# Patient Record
Sex: Female | Born: 2000 | Race: White | Hispanic: No | Marital: Single | Smoking: Never smoker
Health system: Southern US, Community
[De-identification: ages and names within clinical notes are randomized; demographics above are authoritative.]

## PROBLEM LIST (undated history)

## (undated) DIAGNOSIS — F419 Anxiety disorder, unspecified: Secondary | ICD-10-CM

## (undated) DIAGNOSIS — T7840XA Allergy, unspecified, initial encounter: Secondary | ICD-10-CM

## (undated) DIAGNOSIS — F32A Depression, unspecified: Secondary | ICD-10-CM

## (undated) HISTORY — DX: Allergy, unspecified, initial encounter: T78.40XA

## (undated) HISTORY — DX: Depression, unspecified: F32.A

## (undated) HISTORY — DX: Anxiety disorder, unspecified: F41.9

---
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
## 2018-02-19 DIAGNOSIS — R059 Cough, unspecified: Secondary | ICD-10-CM | POA: Insufficient documentation

## 2018-02-19 DIAGNOSIS — J020 Streptococcal pharyngitis: Secondary | ICD-10-CM | POA: Insufficient documentation

## 2019-05-08 ENCOUNTER — Other Ambulatory Visit: Payer: Self-pay | Admitting: Gastroenterology

## 2019-05-08 DIAGNOSIS — R1013 Epigastric pain: Secondary | ICD-10-CM

## 2019-05-11 ENCOUNTER — Ambulatory Visit
Admission: RE | Admit: 2019-05-11 | Discharge: 2019-05-11 | Disposition: A | Discharge status: Home / Self Care | Payer: BLUE CROSS/BLUE SHIELD | Source: Ambulatory Visit | Attending: Gastroenterology | Admitting: Gastroenterology

## 2019-05-11 ENCOUNTER — Other Ambulatory Visit: Payer: Self-pay

## 2019-05-11 DIAGNOSIS — R1013 Epigastric pain: Secondary | ICD-10-CM

## 2019-06-07 DIAGNOSIS — K219 Gastro-esophageal reflux disease without esophagitis: Secondary | ICD-10-CM | POA: Insufficient documentation

## 2020-05-28 DIAGNOSIS — F909 Attention-deficit hyperactivity disorder, unspecified type: Secondary | ICD-10-CM | POA: Insufficient documentation

## 2020-06-02 DIAGNOSIS — F1290 Cannabis use, unspecified, uncomplicated: Secondary | ICD-10-CM | POA: Insufficient documentation

## 2020-07-20 IMAGING — RF UPPER GI W/ SMALL BOWEL
14 of 20 series · 14 of 20 positions shown · non-contrast
Comparison: None.

CLINICAL DATA: Dyspepsia, nausea, stomach cramping

EXAM:
UPPER GI SERIES WITH SMALL BOWEL FOLLOW-THROUGH
FLUOROSCOPY TIME:  Fluoroscopy Time:  48 seconds
Radiation Exposure Index (if provided by the fluoroscopic device):
12.2 mGy
Number of Acquired Spot Images: 0
TECHNIQUE: Combined double contrast and single contrast upper GI series using
effervescent crystals, thick barium, and thin barium. Subsequently,
serial images of the small bowel were obtained including spot views
of the terminal ileum.

[Series 1: t abdomen supine · 0.15mm/px · 1 of 1 slices shown]
[im 1/1]
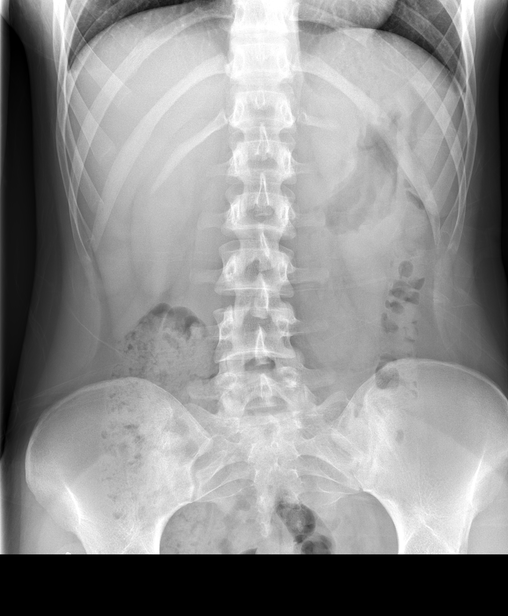

[Series 3: cp_standard · 0.25mm/px · 1 of 1 slices shown (1 of 12)]
[im 1/1]
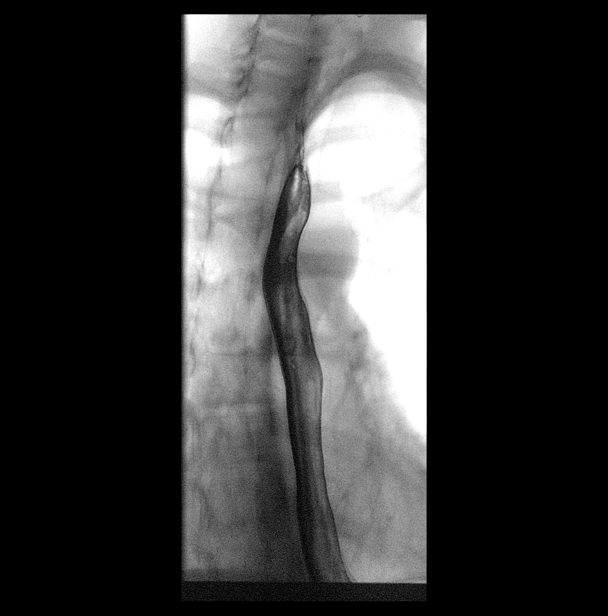

[Series 4: cp_standard · 0.25mm/px · 1 of 1 slices shown (2 of 12)]
[im 1/1]
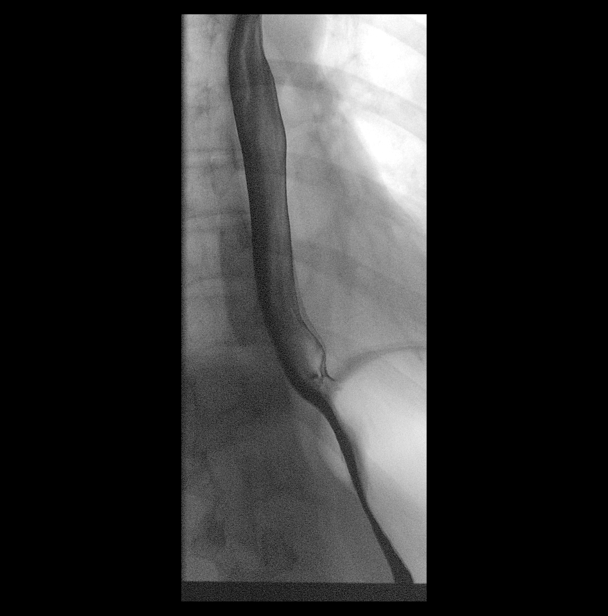

[Series 6: cp_standard · 0.28mm/px · 1 of 1 slices shown (3 of 12)]
[im 1/1]
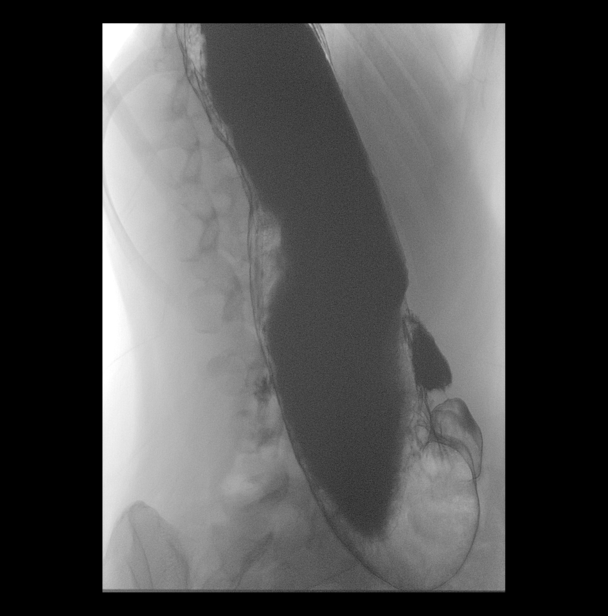

[Series 7: cp_standard · 0.28mm/px · 1 of 1 slices shown (4 of 12)]
[im 1/1]
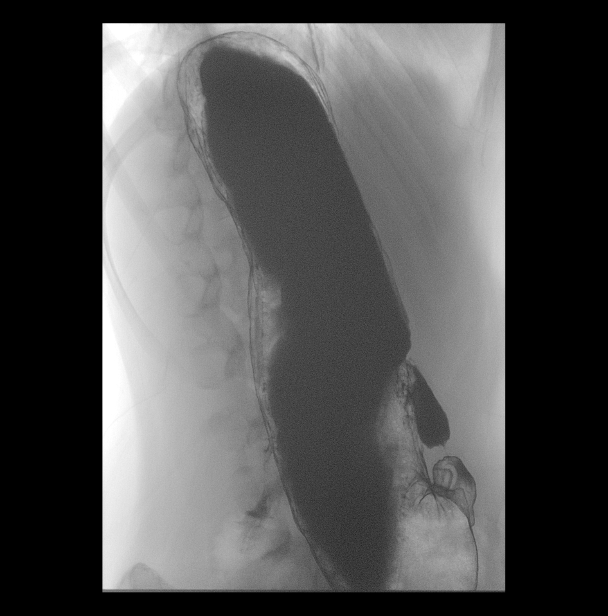

[Series 8: cp_standard · 0.28mm/px · 1 of 1 slices shown (5 of 12)]
[im 1/1]
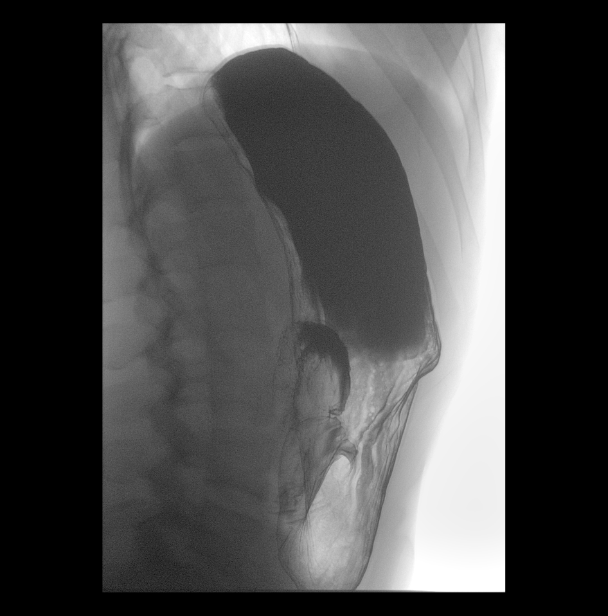

[Series 10: cp_standard · 0.28mm/px · 1 of 1 slices shown (6 of 12)]
[im 1/1]
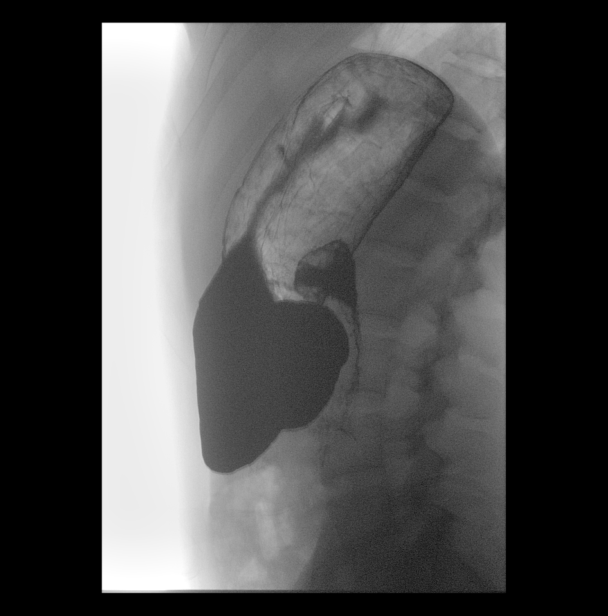

[Series 11: cp_standard · 0.28mm/px · 1 of 1 slices shown (7 of 12)]
[im 1/1]
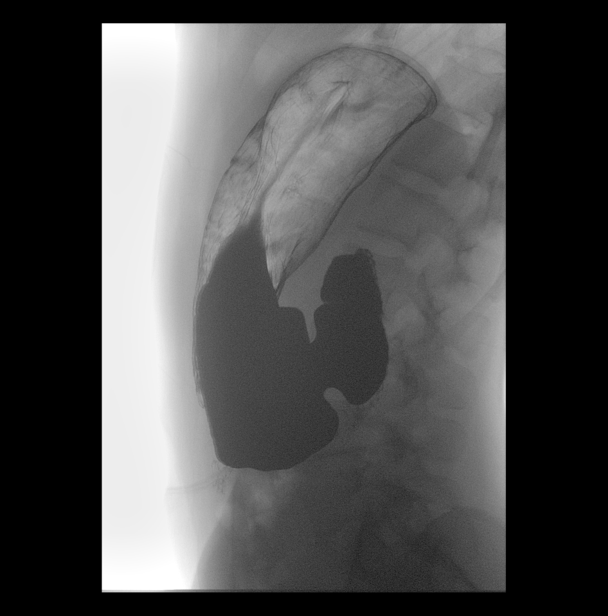

[Series 13: cp_standard · 0.28mm/px · 1 of 1 slices shown (8 of 12)]
[im 1/1]
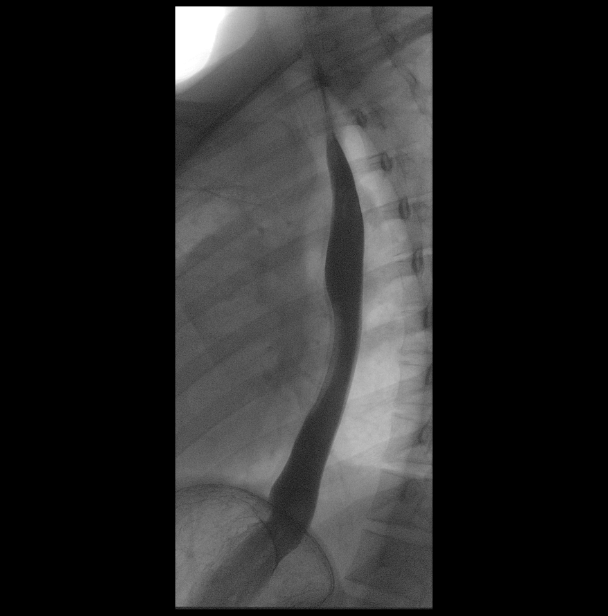

[Series 14: cp_standard · 0.28mm/px · 1 of 1 slices shown (9 of 12)]
[im 1/1]
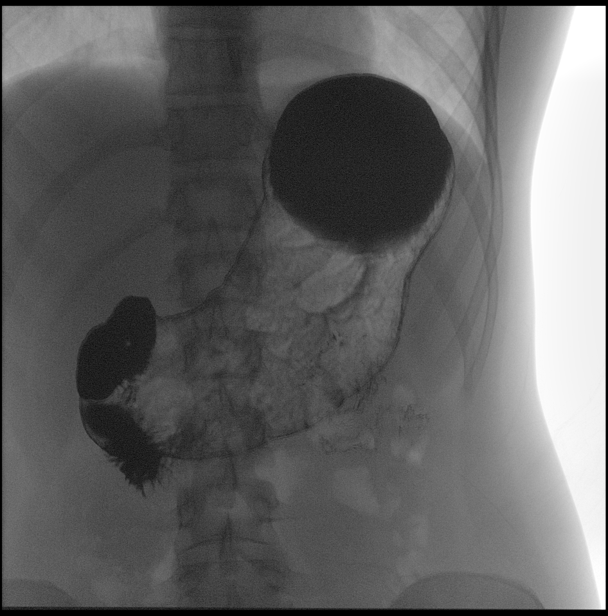

[Series 16: t abdomen barium ap · 0.15mm/px · 1 of 1 slices shown]
[im 1/1]
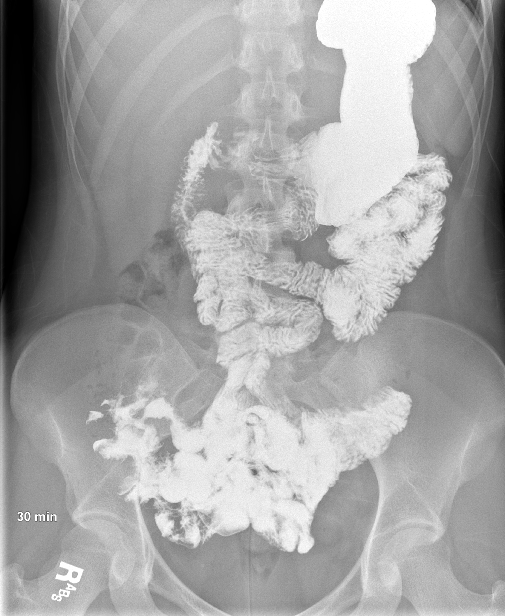

[Series 17: cp_standard · 0.27mm/px · 1 of 1 slices shown (10 of 12)]
[im 1/1]
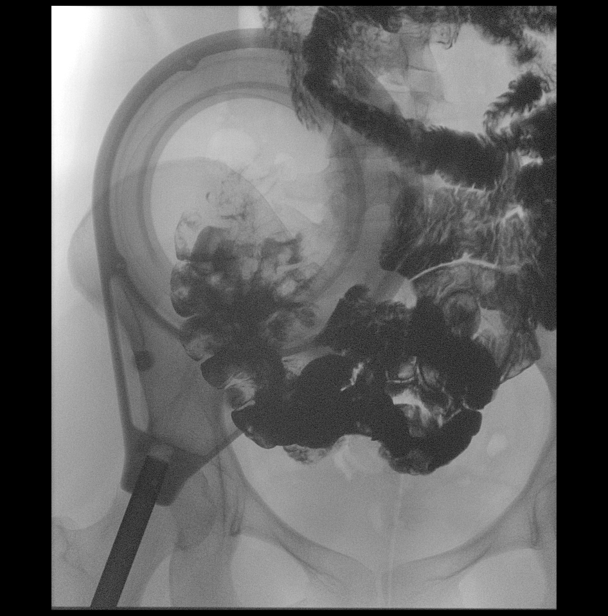

[Series 18: cp_standard · 0.27mm/px · 1 of 1 slices shown (11 of 12)]
[im 1/1]
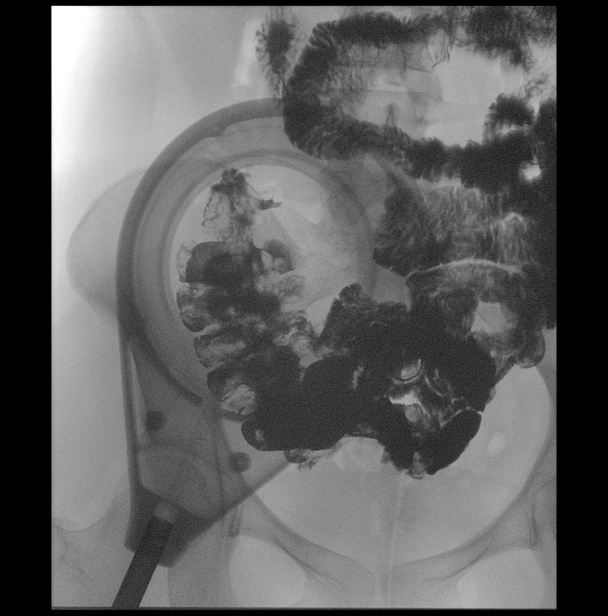

[Series 20: cp_standard · 0.26mm/px · 1 of 1 slices shown (12 of 12)]
[im 1/1]
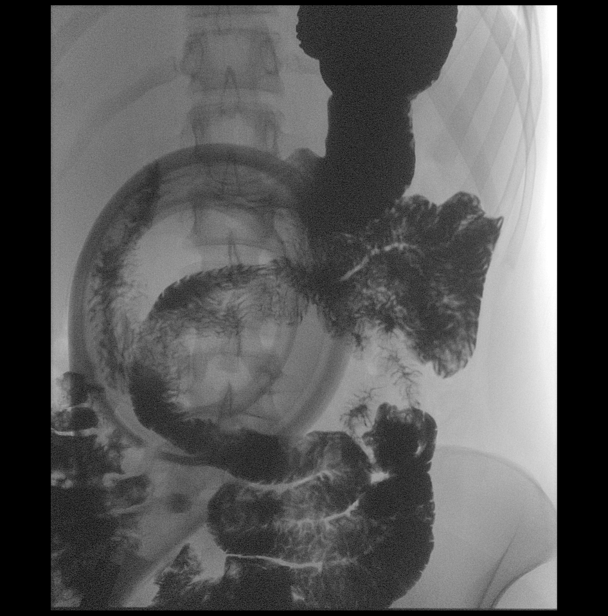

[14 of 20 positions shown; findings below may reference images not displayed]

FINDINGS: KUB:

There is no bowel dilatation to suggest obstruction. There is no
evidence of pneumoperitoneum, portal venous gas or pneumatosis.

There are no pathologic calcifications along the expected course of
the ureters.

The osseous structures are unremarkable.

UPPER GI SERIES AND SMALL-BOWEL FOLLOW-THROUGH:

Examination of the esophagus demonstrated normal esophageal
motility. Normal esophageal morphology without evidence of
esophagitis or ulceration. No esophageal stricture, diverticula, or
mass lesion. No evidence of hiatal hernia. Mild gastroesophageal
reflux.

Examination of the stomach demonstrated normal rugal folds and areae
gastricae. The gastric mucosa appeared unremarkable without evidence
of ulceration, scarring, or mass lesion. Gastric motility and
emptying was normal. Fluoroscopic examination of the duodenum
demonstrates normal motility and morphology without evidence of
ulceration or mass lesion.

Medium density barium was periodically observed under fluoroscopy to
travel from the stomach to the ascending colon (over a 30 minute
time period). There is no evidence of small bowel stricture or
obstruction. No large filling defects to suggest mass lesion. In
addition, there is no evidence of tethering or definite inflammatory
changes present within the small bowel.
IMPRESSION: 1. Mild gastroesophageal reflux.
2. Otherwise normal upper GI.
3. Normal small bowel follow-through.

## 2021-08-26 ENCOUNTER — Ambulatory Visit: Payer: 59 | Admitting: Family Medicine

## 2021-08-26 ENCOUNTER — Other Ambulatory Visit: Payer: Self-pay

## 2021-08-26 ENCOUNTER — Encounter: Payer: Self-pay | Admitting: Family Medicine

## 2021-08-26 VITALS — BP 118/66 | HR 114 | Ht 68.0 in | Wt 137.0 lb

## 2021-08-26 DIAGNOSIS — F419 Anxiety disorder, unspecified: Secondary | ICD-10-CM | POA: Insufficient documentation

## 2021-08-26 DIAGNOSIS — J45909 Unspecified asthma, uncomplicated: Secondary | ICD-10-CM | POA: Insufficient documentation

## 2021-08-26 DIAGNOSIS — J301 Allergic rhinitis due to pollen: Secondary | ICD-10-CM | POA: Diagnosis not present

## 2021-08-26 DIAGNOSIS — J309 Allergic rhinitis, unspecified: Secondary | ICD-10-CM

## 2021-08-26 DIAGNOSIS — J4520 Mild intermittent asthma, uncomplicated: Secondary | ICD-10-CM

## 2021-08-26 DIAGNOSIS — F909 Attention-deficit hyperactivity disorder, unspecified type: Secondary | ICD-10-CM | POA: Insufficient documentation

## 2021-08-26 DIAGNOSIS — Z0000 Encounter for general adult medical examination without abnormal findings: Secondary | ICD-10-CM

## 2021-08-26 DIAGNOSIS — G43909 Migraine, unspecified, not intractable, without status migrainosus: Secondary | ICD-10-CM | POA: Insufficient documentation

## 2021-08-26 MED ORDER — MONTELUKAST SODIUM 10 MG PO TABS: 10.0000 mg | ORAL_TABLET | Freq: Every day | ORAL | 3 refills | Status: DC

## 2021-08-26 MED ORDER — FLUTICASONE FUROATE-VILANTEROL 200-25 MCG/ACT IN AEPB: 1.0000 | INHALATION_SPRAY | Freq: Every day | RESPIRATORY_TRACT | 3 refills | Status: DC

## 2021-08-26 MED ORDER — LEVOCETIRIZINE DIHYDROCHLORIDE 5 MG PO TABS: 5.0000 mg | ORAL_TABLET | Freq: Every evening | ORAL | 3 refills | Status: DC

## 2021-08-26 NOTE — Progress Notes (Signed)
Date:  08/26/2021   Name:  Alexandra Vazquez   DOB:  10-Sep-2001   MRN:  [id number]   Chief Complaint: New Patient (Initial Visit)  Patient is a 20 year old female who presents for a establish care exam. The patient reports the following problems: reactive airway/headache/ allergic rhinitis. Health maintenance has been reviewed up to date.     No results found for: CREATININE, BUN, NA, K, CL, CO2 No results found for: CHOL, HDL, LDLCALC, LDLDIRECT, TRIG, CHOLHDL No results found for: TSH No results found for: HGBA1C Lab Results  Component Value Date   WBC 7.8 02/14/2017   HGB 12.9 02/14/2017   HCT 38.2 02/14/2017   MCV 91.1 02/14/2017   PLT 202 02/14/2017   No results found for: ALT, AST, GGT, ALKPHOS, BILITOT   Review of Systems  Constitutional:  Negative for chills and fever.  HENT:  Negative for drooling, ear discharge, ear pain and sore throat.   Respiratory:  Negative for cough, shortness of breath and wheezing.   Cardiovascular:  Negative for chest pain, palpitations and leg swelling.  Gastrointestinal:  Negative for abdominal pain, blood in stool, constipation, diarrhea and nausea.  Endocrine: Negative for polydipsia.  Genitourinary:  Negative for dysuria, frequency, hematuria and urgency.  Musculoskeletal:  Negative for back pain, myalgias and neck pain.  Skin:  Negative for rash.  Allergic/Immunologic: Negative for environmental allergies.  Neurological:  Negative for dizziness and headaches.  Hematological:  Does not bruise/bleed easily.  Psychiatric/Behavioral:  Negative for suicidal ideas. The patient is not nervous/anxious.    Patient Active Problem List   Diagnosis Date Noted   Migraines 08/26/2021   Anxiety 08/26/2021   ADHD 08/26/2021   Asthma 08/26/2021   Marijuana use 06/02/2020   Adult ADHD 05/28/2020   GERD (gastroesophageal reflux disease) 06/07/2019   Cough 02/19/2018   Pharyngitis, streptococcal 02/19/2018   Pharyngitis 02/14/2017    No  Known Allergies  No past surgical history on file.  Social History   Tobacco Use   Smoking status: Never   Smokeless tobacco: Never  Substance Use Topics   Alcohol use: No   Drug use: No     Medication list has been reviewed and updated.  No outpatient medications have been marked as taking for the 08/26/21 encounter (Office Visit) with Duanne Limerick, MD.    No flowsheet data found.  No flowsheet data found.  BP Readings from Last 3 Encounters:  02/14/17 112/64    Physical Exam Vitals and nursing note reviewed.  Constitutional:      General: She is not in acute distress.    Appearance: She is not diaphoretic.  HENT:     Head: Normocephalic and atraumatic.     Right Ear: External ear normal.     Left Ear: External ear normal.  Eyes:     General:        Right eye: No discharge.        Left eye: No discharge.     Conjunctiva/sclera: Conjunctivae normal.     Pupils: Pupils are equal, round, and reactive to light.  Neck:     Thyroid: No thyromegaly.     Vascular: No JVD.  Cardiovascular:     Rate and Rhythm: Normal rate and regular rhythm.     Heart sounds: Normal heart sounds, S1 normal and S2 normal. No murmur heard. No systolic murmur is present.  No diastolic murmur is present.    No friction rub. No gallop. No  S3 or S4 sounds.  Pulmonary:     Effort: Pulmonary effort is normal.     Breath sounds: Normal breath sounds.  Abdominal:     General: Bowel sounds are normal.     Palpations: Abdomen is soft. There is no mass.     Tenderness: There is no abdominal tenderness. There is no guarding.  Musculoskeletal:     Cervical back: Normal range of motion and neck supple.  Lymphadenopathy:     Cervical: No cervical adenopathy.  Neurological:     Mental Status: She is alert.     Deep Tendon Reflexes: Reflexes are normal and symmetric.    Wt Readings from Last 3 Encounters:  02/14/17 129 lb 12.8 oz (58.9 kg) (60 %, Z= 0.25)*   * Growth percentiles are  based on CDC (Girls, 2-20 Years) data.    Ht 5\' 8"  (1.727 m)   BMI 19.74 kg/m   Assessment and Plan:  1. Encounter for medical examination to establish care Patient establishing care with new physician.  Review of patient's chart for previous encounters most recent labs most recent imaging in [place].  Patient's medications were reviewed and we will continue with her GYN care with OB/GYN and psychotropic medications with her psychiatrist.  2. Mild intermittent asthma without complication Chronic.  Episodic.  Stable.  Continue Singulair 10 mg once a day. - montelukast (SINGULAIR) 10 MG tablet; Take 1 tablet (10 mg total) by mouth at bedtime.  Dispense: 90 tablet; Refill: 3 - fluticasone furoate-vilanterol (BREO ELLIPTA) 200-25 MCG/ACT AEPB; Inhale 1 puff into the lungs daily.  Dispense: 28 each; Refill: 3  3. Allergic rhinitis due to pollen, unspecified seasonality Chronic.  Controlled.  Stable.  Continue Singulair 10 mg once a day.  As well as Xyzal 5 mg once a day. - montelukast (SINGULAIR) 10 MG tablet; Take 1 tablet (10 mg total) by mouth at bedtime.  Dispense: 90 tablet; Refill: 3 - levocetirizine (XYZAL) 5 MG tablet; Take 1 tablet (5 mg total) by mouth every evening.  Dispense: 90 tablet; Refill: 3  4. Allergic sinusitis Patient has some sinus issues with frontal headaches bilateral on a daily basis.  I have suggested that she could start with taking some propranolol that she already takes once a day in the morning to see if this could prophylaxis prevent headaches but most likely these are sinus of the frontal sinus pressure related and addition of Flonase or Nasacort with her above medications would be prudent. - montelukast (SINGULAIR) 10 MG tablet; Take 1 tablet (10 mg total) by mouth at bedtime.  Dispense: 90 tablet; Refill: 3 - levocetirizine (XYZAL) 5 MG tablet; Take 1 tablet (5 mg total) by mouth every evening.  Dispense: 90 tablet; Refill: 3   Patient does have  reactive airway disease she is currently not using albuterol but I have invited her to call if she needs me to send that in in the meantime [facility] has been extended for a year on an as-needed basis.

## 2021-11-07 ENCOUNTER — Ambulatory Visit: Payer: Self-pay | Admitting: Family Medicine

## 2022-06-30 ENCOUNTER — Encounter: Payer: Self-pay | Admitting: Family Medicine

## 2022-07-27 ENCOUNTER — Ambulatory Visit: Payer: 59 | Admitting: Family Medicine

## 2022-07-31 LAB — HM PAP SMEAR: HM Pap smear: NORMAL

## 2022-07-31 LAB — RESULTS CONSOLE HPV: CHL HPV: NEGATIVE

## 2022-08-22 ENCOUNTER — Other Ambulatory Visit: Payer: Self-pay | Admitting: Family Medicine

## 2022-08-22 DIAGNOSIS — J301 Allergic rhinitis due to pollen: Secondary | ICD-10-CM

## 2022-08-22 DIAGNOSIS — J309 Allergic rhinitis, unspecified: Secondary | ICD-10-CM

## 2022-08-24 NOTE — Telephone Encounter (Signed)
Patient needs OV, will refill medication until OV is made. Patient needs OV for additional refills.  Requested Prescriptions  Pending Prescriptions Disp Refills   levocetirizine (XYZAL) 5 MG tablet [Pharmacy Med Name: LEVOCETIRIZINE 5 MG TABLET] 30 tablet 0    Sig: TAKE 1 TABLET BY MOUTH EVERY DAY IN THE EVENING     Ear, Nose, and Throat:  Antihistamines - levocetirizine dihydrochloride Failed - 08/22/2022  8:57 AM      Failed - Cr in normal range and within 360 days    No results found for: "CREATININE", "LABCREAU", "LABCREA", "POCCRE"       Failed - eGFR is 10 or above and within 360 days    No results found for: "GFRAA", "GFRNONAA", "GFR", "EGFR"       Failed - Valid encounter within last 12 months    Recent Outpatient Visits           12 months ago Encounter for medical examination to establish care   [facility] [place], Deanna C, MD

## 2022-09-15 ENCOUNTER — Other Ambulatory Visit: Payer: Self-pay | Admitting: Family Medicine

## 2022-09-15 DIAGNOSIS — J301 Allergic rhinitis due to pollen: Secondary | ICD-10-CM

## 2022-09-15 DIAGNOSIS — J309 Allergic rhinitis, unspecified: Secondary | ICD-10-CM

## 2022-09-15 NOTE — Telephone Encounter (Signed)
Requested medication (s) are due for refill today: routing for review.  Requested medication (s) are on the active medication list: yes  Last refill:  08/24/22  Future visit scheduled: no  Notes to clinic:   [place]. DX Code Needed.      Requested Prescriptions  Pending Prescriptions Disp Refills   levocetirizine (XYZAL) 5 MG tablet [Pharmacy Med Name: LEVOCETIRIZINE 5 MG TABLET] 90 tablet 1    Sig: TAKE 1 TABLET BY MOUTH EVERY DAY IN THE EVENING     Ear, Nose, and Throat:  Antihistamines - levocetirizine dihydrochloride Failed - 09/15/2022  2:33 PM      Failed - Cr in normal range and within 360 days    No results found for: "CREATININE", "LABCREAU", "LABCREA", "POCCRE"       Failed - eGFR is 10 or above and within 360 days    No results found for: "GFRAA", "GFRNONAA", "GFR", "EGFR"       Failed - Valid encounter within last 12 months    Recent Outpatient Visits           1 year ago Encounter for medical examination to establish care   [facility] [place], Deanna C, MD

## 2022-09-16 NOTE — Telephone Encounter (Signed)
Left voice mail to set up medication refill appointment °

## 2022-09-19 ENCOUNTER — Other Ambulatory Visit: Payer: Self-pay | Admitting: Family Medicine

## 2022-09-19 DIAGNOSIS — J301 Allergic rhinitis due to pollen: Secondary | ICD-10-CM

## 2022-09-19 DIAGNOSIS — J309 Allergic rhinitis, unspecified: Secondary | ICD-10-CM

## 2022-09-20 ENCOUNTER — Other Ambulatory Visit: Payer: Self-pay | Admitting: Family Medicine

## 2022-09-20 DIAGNOSIS — J309 Allergic rhinitis, unspecified: Secondary | ICD-10-CM

## 2022-09-20 DIAGNOSIS — J301 Allergic rhinitis due to pollen: Secondary | ICD-10-CM

## 2022-09-20 DIAGNOSIS — J4520 Mild intermittent asthma, uncomplicated: Secondary | ICD-10-CM

## 2022-09-21 NOTE — Telephone Encounter (Signed)
Requested Prescriptions  Pending Prescriptions Disp Refills   montelukast (SINGULAIR) 10 MG tablet [Pharmacy Med Name: MONTELUKAST SOD 10 MG TABLET] 90 tablet 0    Sig: TAKE 1 TABLET BY MOUTH EVERYDAY AT BEDTIME     Pulmonology:  Leukotriene Inhibitors Failed - 09/20/2022  8:51 AM      Failed - Valid encounter within last 12 months    Recent Outpatient Visits           1 year ago Encounter for medical examination to establish care   [facility] at [facility], MD       Future Appointments             Tomorrow Duanne Limerick, MD [facility] at [facility], [facility]

## 2022-09-21 NOTE — Telephone Encounter (Signed)
Requested medication (s) are due for refill today: yes  Requested medication (s) are on the active medication list: yes  Last refill:  08/24/22 #30  Future visit scheduled: no  Notes to clinic:  called pt and LM on VM to call back to schedule appointment   Requested Prescriptions  Pending Prescriptions Disp Refills   levocetirizine (XYZAL) 5 MG tablet [Pharmacy Med Name: LEVOCETIRIZINE 5 MG TABLET] 30 tablet 0    Sig: TAKE 1 TABLET BY MOUTH EVERY DAY IN THE EVENING     Ear, Nose, and Throat:  Antihistamines - levocetirizine dihydrochloride Failed - 09/19/2022  9:39 AM      Failed - Cr in normal range and within 360 days    No results found for: "CREATININE", "LABCREAU", "LABCREA", "POCCRE"       Failed - eGFR is 10 or above and within 360 days    No results found for: "GFRAA", "GFRNONAA", "GFR", "EGFR"       Failed - Valid encounter within last 12 months    Recent Outpatient Visits           1 year ago Encounter for medical examination to establish care   [place] [facility] [place], Deanna C, MD

## 2022-09-22 ENCOUNTER — Encounter: Payer: Self-pay | Admitting: Family Medicine

## 2022-09-22 ENCOUNTER — Ambulatory Visit: Payer: 59 | Admitting: Family Medicine

## 2022-09-22 VITALS — BP 110/68 | HR 78 | Ht 68.0 in | Wt 157.0 lb

## 2022-09-22 DIAGNOSIS — J309 Allergic rhinitis, unspecified: Secondary | ICD-10-CM | POA: Diagnosis not present

## 2022-09-22 DIAGNOSIS — J301 Allergic rhinitis due to pollen: Secondary | ICD-10-CM

## 2022-09-22 DIAGNOSIS — J4520 Mild intermittent asthma, uncomplicated: Secondary | ICD-10-CM | POA: Diagnosis not present

## 2022-09-22 DIAGNOSIS — K219 Gastro-esophageal reflux disease without esophagitis: Secondary | ICD-10-CM

## 2022-09-22 MED ORDER — MONTELUKAST SODIUM 10 MG PO TABS: ORAL_TABLET | ORAL | 1 refills | Status: AC

## 2022-09-22 MED ORDER — FAMOTIDINE 20 MG PO TABS: 20.0000 mg | ORAL_TABLET | Freq: Two times a day (BID) | ORAL | 1 refills | Status: DC

## 2022-09-22 MED ORDER — LEVOCETIRIZINE DIHYDROCHLORIDE 5 MG PO TABS: 5.0000 mg | ORAL_TABLET | Freq: Every evening | ORAL | 1 refills | Status: DC

## 2022-09-22 MED ORDER — ALBUTEROL SULFATE HFA 108 (90 BASE) MCG/ACT IN AERS: 2.0000 | INHALATION_SPRAY | Freq: Four times a day (QID) | RESPIRATORY_TRACT | 5 refills | Status: AC | PRN

## 2022-09-22 NOTE — Progress Notes (Signed)
Date:  09/22/2022   Name:  Alexandra Vazquez   DOB:  Sep 15, 2001   MRN:  [id number]   Chief Complaint: Allergic Rhinitis  and Asthma  Asthma There is no chest tightness, cough, difficulty breathing, frequent throat clearing, hemoptysis, hoarse voice, shortness of breath, sputum production or wheezing. Primary symptoms comments: Exercise induced. This is a chronic problem. The current episode started more than 1 year ago. The problem has been gradually improving. Pertinent negatives include no appetite change, chest pain, dyspnea on exertion, ear congestion, fever, headaches, malaise/fatigue, myalgias, nasal congestion, postnasal drip, rhinorrhea, sneezing, sore throat or sweats. Her symptoms are alleviated by leukotriene antagonist and beta-agonist (xyzal/). She reports moderate improvement on treatment. There are no known risk factors for lung disease. Her past medical history is significant for asthma.    No results found for: "NA", "K", "CO2", "GLUCOSE", "BUN", "CREATININE", "CALCIUM", "EGFR", "GFRNONAA" No results found for: "CHOL", "HDL", "LDLCALC", "LDLDIRECT", "TRIG", "CHOLHDL" No results found for: "TSH" No results found for: "HGBA1C" Lab Results  Component Value Date   WBC 7.8 02/14/2017   HGB 12.9 02/14/2017   HCT 38.2 02/14/2017   MCV 91.1 02/14/2017   PLT 202 02/14/2017   No results found for: "ALT", "AST", "GGT", "ALKPHOS", "BILITOT" No results found for: "25OHVITD2", "25OHVITD3", "VD25OH"   Review of Systems  Constitutional:  Negative for appetite change, fever and malaise/fatigue.  HENT:  Negative for hoarse voice, postnasal drip, rhinorrhea, sneezing and sore throat.   Respiratory:  Negative for cough, hemoptysis, sputum production, chest tightness, shortness of breath and wheezing.   Cardiovascular:  Negative for chest pain, dyspnea on exertion and palpitations.  Gastrointestinal:  Negative for abdominal pain and blood in stool.  Genitourinary:  Negative for  difficulty urinating.  Musculoskeletal:  Negative for myalgias.  Neurological:  Negative for headaches.    Patient Active Problem List   Diagnosis Date Noted   Migraines 08/26/2021   Anxiety 08/26/2021   ADHD 08/26/2021   Asthma 08/26/2021   Marijuana use 06/02/2020   Adult ADHD 05/28/2020   GERD (gastroesophageal reflux disease) 06/07/2019   Cough 02/19/2018   Pharyngitis, streptococcal 02/19/2018   Pharyngitis 02/14/2017    No Known Allergies  No past surgical history on file.  Social History   Tobacco Use   Smoking status: Never   Smokeless tobacco: Never  Vaping Use   Vaping Use: Some days   Substances: THC, Flavoring   Devices: every other day  Substance Use Topics   Alcohol use: No   Drug use: Yes    Types: Marijuana     Medication list has been reviewed and updated.  Current Meds  Medication Sig   buPROPion (WELLBUTRIN XL) 150 MG 24 hr tablet Take 150 mg by mouth daily. PSYCH- Dr. Marylou Flesher in [place] 27 MG CR tablet Take 27 mg by mouth every morning. PSYCH- Dr Enid Skeens in [place]   levocetirizine (XYZAL) 5 MG tablet TAKE 1 TABLET BY MOUTH EVERY DAY IN THE EVENING   montelukast (SINGULAIR) 10 MG tablet TAKE 1 TABLET BY MOUTH EVERYDAY AT BEDTIME   norgestimate-ethinyl estradiol (ORTHO-CYCLEN,SPRINTEC,PREVIFEM) 0.25-35 MG-MCG tablet Take 1 tablet by mouth daily. GYN- [facility]   propranolol (INDERAL) 10 MG tablet Take 10 mg by mouth 3 (three) times daily as needed. PSYCH   QUEtiapine (SEROQUEL) 100 MG tablet Take 100 mg by mouth at bedtime as needed. PSYCH   sertraline (ZOLOFT) 50 MG tablet Take 50 mg by mouth daily.  PSYCH- Dr. Mickey Farber       09/22/2022    3:47 PM 08/26/2021    2:33 PM  GAD 7 : Generalized Anxiety Score  Nervous, Anxious, on Edge 0 1  Control/stop worrying 0 0  Worry too much - different things 0 0  Trouble relaxing 0 1  Restless 0 0  Easily annoyed or irritable 0 1  Afraid - awful  might happen 0 0  Total GAD 7 Score 0 3  Anxiety Difficulty Not difficult at all Very difficult       09/22/2022    3:47 PM 08/26/2021    2:33 PM  Depression screen PHQ 2/9  Decreased Interest 0 1  Down, Depressed, Hopeless 0 1  PHQ - 2 Score 0 2  Altered sleeping 0 0  Tired, decreased energy 0 2  Change in appetite 0 2  Feeling bad or failure about yourself  0 0  Trouble concentrating 0 1  Moving slowly or fidgety/restless 0 1  Suicidal thoughts 0 0  PHQ-9 Score 0 8  Difficult doing work/chores Not difficult at all Very difficult    BP Readings from Last 3 Encounters:  09/22/22 110/68  08/26/21 118/66  02/14/17 112/64    Physical Exam Vitals and nursing note reviewed. Exam conducted with a chaperone present.  Constitutional:      General: She is not in acute distress.    Appearance: She is not diaphoretic.  HENT:     Head: Normocephalic and atraumatic.     Right Ear: Tympanic membrane and external ear normal.     Left Ear: Tympanic membrane and external ear normal.     Nose: Nose normal. No congestion or rhinorrhea.     Mouth/Throat:     Mouth: Mucous membranes are moist.  Eyes:     General:        Right eye: No discharge.        Left eye: No discharge.     Conjunctiva/sclera: Conjunctivae normal.     Pupils: Pupils are equal, round, and reactive to light.  Neck:     Thyroid: No thyromegaly.     Vascular: No JVD.  Cardiovascular:     Rate and Rhythm: Normal rate and regular rhythm.     Heart sounds: Normal heart sounds. No murmur heard.    No friction rub. No gallop.  Pulmonary:     Effort: Pulmonary effort is normal.     Breath sounds: Normal breath sounds. No wheezing, rhonchi or rales.  Abdominal:     General: Bowel sounds are normal.     Palpations: Abdomen is soft. There is no mass.     Tenderness: There is no abdominal tenderness. There is no guarding.  Musculoskeletal:        General: Normal range of motion.     Cervical back: Normal range of  motion and neck supple.  Lymphadenopathy:     Cervical: No cervical adenopathy.  Skin:    General: Skin is warm and dry.  Neurological:     Mental Status: She is alert.     Deep Tendon Reflexes: Reflexes are normal and symmetric.     Wt Readings from Last 3 Encounters:  09/22/22 157 lb (71.2 kg)  08/26/21 137 lb (62.1 kg)  02/14/17 129 lb 12.8 oz (58.9 kg) (60 %, Z= 0.25)*   * Growth percentiles are based on CDC (Girls, 2-20 Years) data.    BP 110/68   Pulse 78   Ht _0  (  1.727 m)   Wt 157 lb (71.2 kg)   LMP 09/22/2022 (Approximate)   SpO2 99%   BMI 23.87 kg/m   Assessment and Plan: 1. Mild intermittent asthma without complication Chronic.  Intermittent.  Stable.  Controlled on Singulair 10 mg once a day and albuterol inhaler 1 to 2 puffs every 6 hours.  Will recheck in 6 months - montelukast (SINGULAIR) 10 MG tablet; TAKE 1 TABLET BY MOUTH EVERYDAY AT BEDTIME  Dispense: 90 tablet; Refill: 1 - albuterol (VENTOLIN HFA) 108 (90 Base) MCG/ACT inhaler; Inhale 2 puffs into the lungs every 6 (six) hours as needed for wheezing or shortness of breath.  Dispense: 18 g; Refill: 5  2. Allergic sinusitis Chronic.  Controlled.  Stable.  Continue Xyzal 5 mg once a day and Singulair 10 mg once a day as needed. - montelukast (SINGULAIR) 10 MG tablet; TAKE 1 TABLET BY MOUTH EVERYDAY AT BEDTIME  Dispense: 90 tablet; Refill: 1 - levocetirizine (XYZAL) 5 MG tablet; Take 1 tablet (5 mg total) by mouth every evening.  Dispense: 90 tablet; Refill: 1  3. Allergic rhinitis due to pollen, unspecified seasonality As noted above. - montelukast (SINGULAIR) 10 MG tablet; TAKE 1 TABLET BY MOUTH EVERYDAY AT BEDTIME  Dispense: 90 tablet; Refill: 1 - levocetirizine (XYZAL) 5 MG tablet; Take 1 tablet (5 mg total) by mouth every evening.  Dispense: 90 tablet; Refill: 1  4. Gastroesophageal reflux disease, unspecified whether esophagitis present Chronic.  Controlled on omeprazole over-the-counter.  Stable.   Would like to wean off of omeprazole and would like to initiate Pepcid.  We will initiate Pepcid at 20 mg twice a day and if tolerated over 2 months we will extend prescription for 6 months. - famotidine (PEPCID) 20 MG tablet; Take 1 tablet (20 mg total) by mouth 2 (two) times daily.  Dispense: 60 tablet; Refill: 1     Otilio Miu, MD

## 2022-10-15 ENCOUNTER — Other Ambulatory Visit: Payer: Self-pay | Admitting: Family Medicine

## 2022-10-15 DIAGNOSIS — K219 Gastro-esophageal reflux disease without esophagitis: Secondary | ICD-10-CM

## 2022-10-15 NOTE — Telephone Encounter (Signed)
Unable to refill per protocol, Rx request is too soon. Last refill 09/22/22 for 30 days and 1 refill.  Requested Prescriptions  Pending Prescriptions Disp Refills   famotidine (PEPCID) 20 MG tablet [Pharmacy Med Name: FAMOTIDINE 20 MG TABLET] 180 tablet 1    Sig: TAKE 1 TABLET BY MOUTH TWICE A DAY     Gastroenterology:  H2 Antagonists Passed - 10/15/2022  2:32 PM      Passed - Valid encounter within last 12 months    Recent Outpatient Visits           3 weeks ago Mild intermittent asthma without complication   [facility] [place], Deanna C, MD   1 year ago Encounter for medical examination to establish care   [facility] [place], Deanna C, MD

## 2022-11-16 ENCOUNTER — Other Ambulatory Visit: Payer: Self-pay | Admitting: Family Medicine

## 2022-11-16 DIAGNOSIS — K219 Gastro-esophageal reflux disease without esophagitis: Secondary | ICD-10-CM

## 2022-11-17 NOTE — Telephone Encounter (Signed)
Requested Prescriptions  Pending Prescriptions Disp Refills   famotidine (PEPCID) 20 MG tablet [Pharmacy Med Name: FAMOTIDINE 20 MG TABLET] 60 tablet 1    Sig: TAKE 1 TABLET BY MOUTH TWICE A DAY     Gastroenterology:  H2 Antagonists Passed - 11/16/2022  1:30 AM      Passed - Valid encounter within last 12 months    Recent Outpatient Visits           1 month ago Mild intermittent asthma without complication   [facility], MD   1 year ago Encounter for medical examination to establish care   [facility], MD

## 2023-03-02 ENCOUNTER — Other Ambulatory Visit: Payer: Self-pay | Admitting: Family Medicine

## 2023-03-02 DIAGNOSIS — J301 Allergic rhinitis due to pollen: Secondary | ICD-10-CM

## 2023-03-02 DIAGNOSIS — J309 Allergic rhinitis, unspecified: Secondary | ICD-10-CM

## 2023-08-25 ENCOUNTER — Other Ambulatory Visit: Payer: Self-pay | Admitting: Family Medicine

## 2023-08-25 DIAGNOSIS — J301 Allergic rhinitis due to pollen: Secondary | ICD-10-CM

## 2023-08-25 DIAGNOSIS — J309 Allergic rhinitis, unspecified: Secondary | ICD-10-CM
